# Patient Record
Sex: Female | Born: 1980 | Race: Black or African American | Hispanic: No | Marital: Married | Smoking: Never smoker
Health system: Southern US, Community
[De-identification: ages and names within clinical notes are randomized; demographics above are authoritative.]

## PROBLEM LIST (undated history)

## (undated) DIAGNOSIS — E559 Vitamin D deficiency, unspecified: Secondary | ICD-10-CM

## (undated) HISTORY — DX: Vitamin D deficiency, unspecified: E55.9

---

## 2006-03-26 ENCOUNTER — Inpatient Hospital Stay (HOSPITAL_COMMUNITY): Admission: AD | Admit: 2006-03-26 | Discharge: 2006-03-27 | Payer: Self-pay | Admitting: Gynecology

## 2006-03-29 ENCOUNTER — Inpatient Hospital Stay (HOSPITAL_COMMUNITY): Admission: AD | Admit: 2006-03-29 | Discharge: 2006-03-29 | Payer: Self-pay | Admitting: *Deleted

## 2006-03-31 ENCOUNTER — Inpatient Hospital Stay (HOSPITAL_COMMUNITY): Admission: AD | Admit: 2006-03-31 | Discharge: 2006-03-31 | Payer: Self-pay | Admitting: Obstetrics and Gynecology

## 2006-07-03 ENCOUNTER — Encounter (INDEPENDENT_AMBULATORY_CARE_PROVIDER_SITE_OTHER): Payer: Self-pay | Admitting: Specialist

## 2006-07-03 ENCOUNTER — Inpatient Hospital Stay (HOSPITAL_COMMUNITY): Admission: AD | Admit: 2006-07-03 | Discharge: 2006-07-03 | Payer: Self-pay | Admitting: Obstetrics and Gynecology

## 2007-02-20 ENCOUNTER — Observation Stay (HOSPITAL_COMMUNITY): Admission: AD | Admit: 2007-02-20 | Discharge: 2007-02-21 | Payer: Self-pay | Admitting: Obstetrics and Gynecology

## 2007-03-02 ENCOUNTER — Ambulatory Visit: Payer: Self-pay | Admitting: Oncology

## 2007-03-03 LAB — CBC WITH DIFFERENTIAL (CANCER CENTER ONLY)
BASO%: 0.6 % (ref 0.0–2.0)
Eosinophils Absolute: 0.2 10*3/uL (ref 0.0–0.5)
HGB: 12.1 g/dL (ref 11.6–15.9)
LYMPH%: 11.4 % — ABNORMAL LOW (ref 14.0–48.0)
MCH: 30.8 pg (ref 26.0–34.0)
MCV: 91 fL (ref 81–101)
MONO%: 6.5 % (ref 0.0–13.0)
NEUT%: 79.5 % (ref 39.6–80.0)
RBC: 3.93 10*6/uL (ref 3.70–5.32)
WBC: 10.5 10*3/uL — ABNORMAL HIGH (ref 3.9–10.0)

## 2007-03-03 LAB — COMPREHENSIVE METABOLIC PANEL
Chloride: 103 mEq/L (ref 96–112)
Potassium: 3.2 mEq/L — ABNORMAL LOW (ref 3.5–5.3)
Sodium: 133 mEq/L — ABNORMAL LOW (ref 135–145)
Total Bilirubin: 0.7 mg/dL (ref 0.3–1.2)

## 2007-03-08 LAB — HYPERCOAGULABLE PANEL, COMPREHENSIVE
AntiThromb III Func: 100 % (ref 76–126)
Anticardiolipin IgA: <7 [APL'U] (ref <13)
Anticardiolipin IgM: 26 [MPL'U] (ref <10)
Beta-2-Glycoprotein I IgM: 9 U/mL (ref <10)
DRVVT: 38.4 secs (ref 36.1–47.0)
Homocysteine: 4.5 umol/L (ref 4.0–15.4)
PTT Lupus Anticoagulant: 62.5 secs — ABNORMAL HIGH (ref 36.3–48.8)
PTTLA 4:1 Mix: 62.5 secs — ABNORMAL HIGH (ref 36.3–48.8)
PTTLA Confirmation: 17.5 secs — ABNORMAL HIGH (ref <8.0)
Protein C Activity: 117 % (ref 75–133)
Protein S Activity: 25 % — ABNORMAL LOW (ref 69–129)
Protein S Ag, Total: 114 % (ref 70–140)

## 2007-04-18 ENCOUNTER — Ambulatory Visit: Payer: Self-pay | Admitting: Oncology

## 2007-05-17 LAB — CBC WITH DIFFERENTIAL (CANCER CENTER ONLY)
BASO#: 0 10e3/uL (ref 0.0–0.2)
BASO%: 0.5 % (ref 0.0–2.0)
EOS%: 2 % (ref 0.0–7.0)
Eosinophils Absolute: 0.1 10e3/uL (ref 0.0–0.5)
HCT: 29.5 % — ABNORMAL LOW (ref 34.8–46.6)
HGB: 10.2 g/dL — ABNORMAL LOW (ref 11.6–15.9)
LYMPH#: 1.2 10e3/uL (ref 0.9–3.3)
LYMPH%: 18.1 % (ref 14.0–48.0)
MCH: 32 pg (ref 26.0–34.0)
MCHC: 34.6 g/dL (ref 32.0–36.0)
MCV: 92 fL (ref 81–101)
MONO#: 0.4 10e3/uL (ref 0.1–0.9)
MONO%: 5.2 % (ref 0.0–13.0)
NEUT#: 5.1 10e3/uL (ref 1.5–6.5)
NEUT%: 74.2 % (ref 39.6–80.0)
Platelets: 155 10e3/uL (ref 145–400)
RBC: 3.19 10e6/uL — ABNORMAL LOW (ref 3.70–5.32)
RDW: 12.1 % (ref 10.5–14.6)
WBC: 6.8 10e3/uL (ref 3.9–10.0)

## 2007-07-26 ENCOUNTER — Ambulatory Visit: Payer: Self-pay | Admitting: Oncology

## 2007-07-28 LAB — CBC WITH DIFFERENTIAL (CANCER CENTER ONLY)
BASO#: 0.1 10*3/uL (ref 0.0–0.2)
BASO%: 0.6 % (ref 0.0–2.0)
EOS%: 1.8 % (ref 0.0–7.0)
HCT: 31.1 % — ABNORMAL LOW (ref 34.8–46.6)
HGB: 10.6 g/dL — ABNORMAL LOW (ref 11.6–15.9)
LYMPH#: 1.4 10*3/uL (ref 0.9–3.3)
LYMPH%: 13 % — ABNORMAL LOW (ref 14.0–48.0)
MONO#: 0.7 10*3/uL (ref 0.1–0.9)
MONO%: 6 % (ref 0.0–13.0)
NEUT#: 8.5 10*3/uL — ABNORMAL HIGH (ref 1.5–6.5)
RBC: 3.3 10*6/uL — ABNORMAL LOW (ref 3.70–5.32)

## 2007-08-24 ENCOUNTER — Inpatient Hospital Stay (HOSPITAL_COMMUNITY): Admission: AD | Admit: 2007-08-24 | Discharge: 2007-08-24 | Payer: Self-pay | Admitting: *Deleted

## 2007-08-25 ENCOUNTER — Inpatient Hospital Stay (HOSPITAL_COMMUNITY): Admission: AD | Admit: 2007-08-25 | Discharge: 2007-08-25 | Payer: Self-pay | Admitting: Obstetrics

## 2007-09-26 ENCOUNTER — Inpatient Hospital Stay (HOSPITAL_COMMUNITY): Admission: AD | Admit: 2007-09-26 | Discharge: 2007-09-28 | Payer: Self-pay | Admitting: Obstetrics and Gynecology

## 2007-10-26 ENCOUNTER — Ambulatory Visit: Payer: Self-pay | Admitting: Oncology

## 2007-11-07 LAB — CBC WITH DIFFERENTIAL (CANCER CENTER ONLY)
BASO#: 0 10*3/uL (ref 0.0–0.2)
EOS%: 4 % (ref 0.0–7.0)
Eosinophils Absolute: 0.2 10*3/uL (ref 0.0–0.5)
LYMPH%: 29 % (ref 14.0–48.0)
MCV: 90 fL (ref 81–101)
MONO%: 7.3 % (ref 0.0–13.0)
NEUT%: 59.3 % (ref 39.6–80.0)
Platelets: 178 10*3/uL (ref 145–400)
RDW: 11.1 % (ref 10.5–14.6)
WBC: 4.9 10*3/uL (ref 3.9–10.0)

## 2007-11-09 LAB — PROTEIN C ACTIVITY: Protein C Activity: 119 % (ref 75–133)

## 2007-11-09 LAB — PROTEIN C, TOTAL: Protein C, Total: 71 % (ref 70–140)

## 2007-11-09 LAB — PROTEIN S ACTIVITY: Protein S Activity: 26 % — ABNORMAL LOW (ref 69–129)

## 2007-11-09 LAB — PROTEIN S, ANTIGEN, FREE: Protein S Ag, Free: 68 % normal (ref 50–147)

## 2009-03-29 IMAGING — US US OB TRANSVAGINAL
1 series · 14 of 28 positions shown · non-contrast
Comparison: None.

CLINICAL DATA: Positive pregnancy test, nausea.  
 TRANSVAGINAL OBSTETRICAL ULTRASOUND:

[Series 1: us ob transvaginal · 14 of 34 slices shown]
[im 2/34]
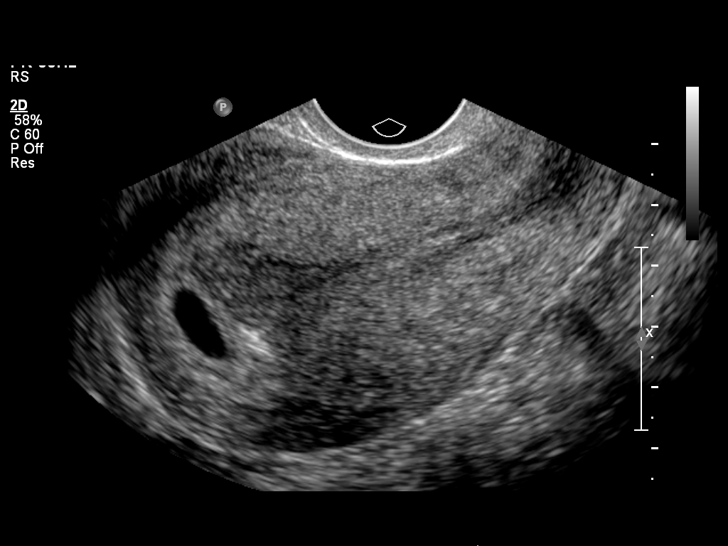
[im 4/34]
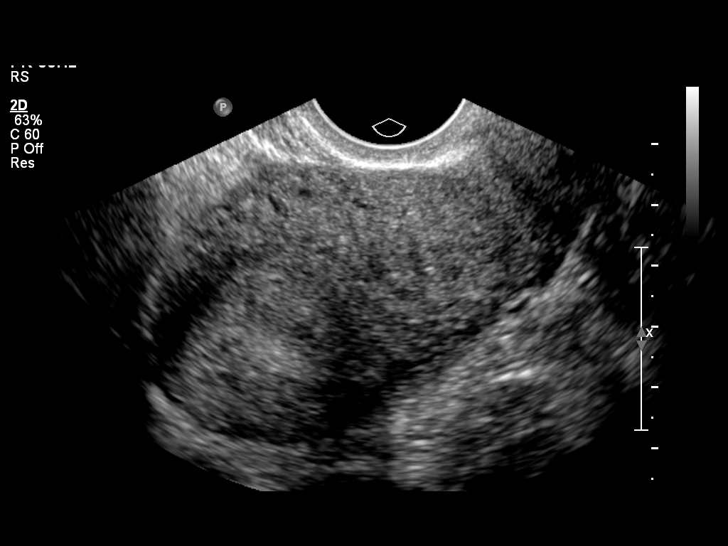
[im 7/34]
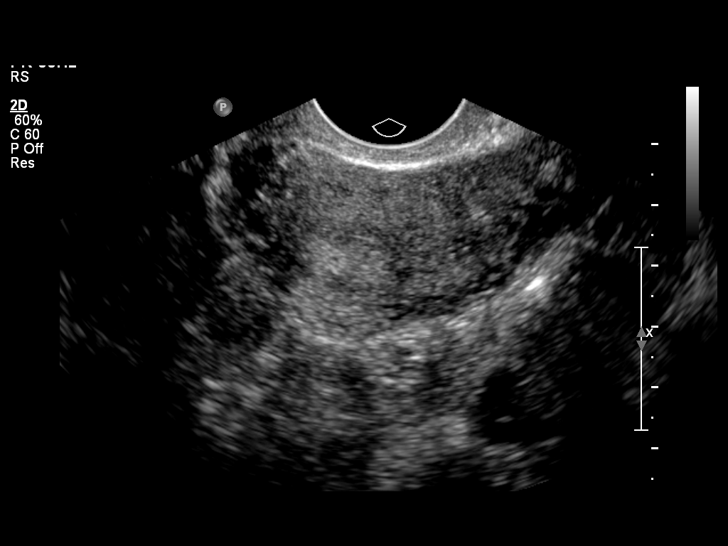
[im 9/34]
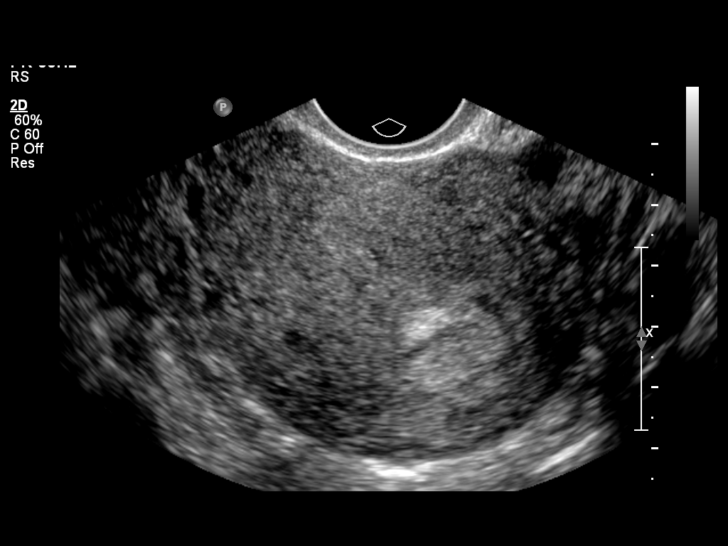
[im 12/34]
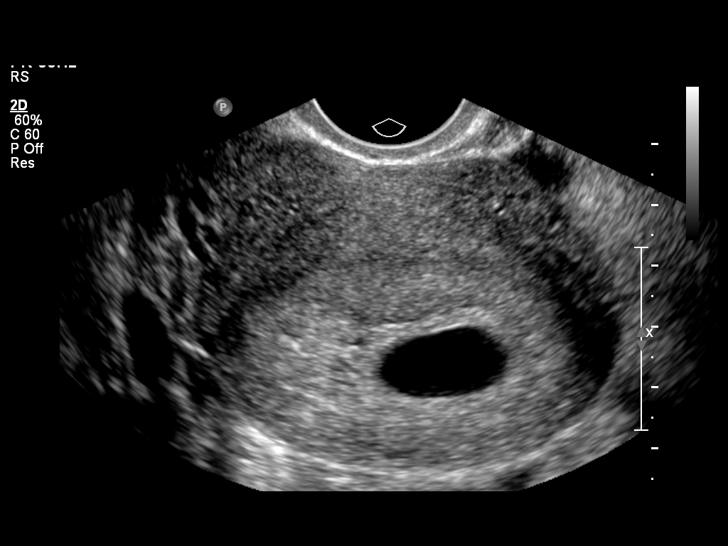
[im 14/34]
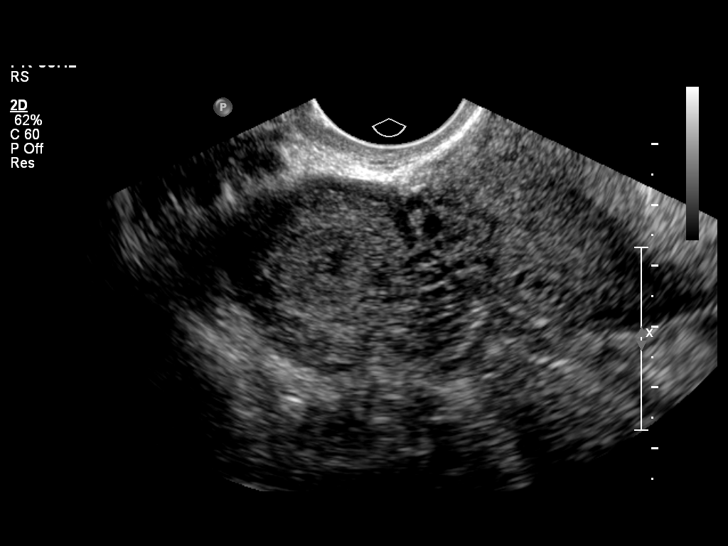
[im 16/34]
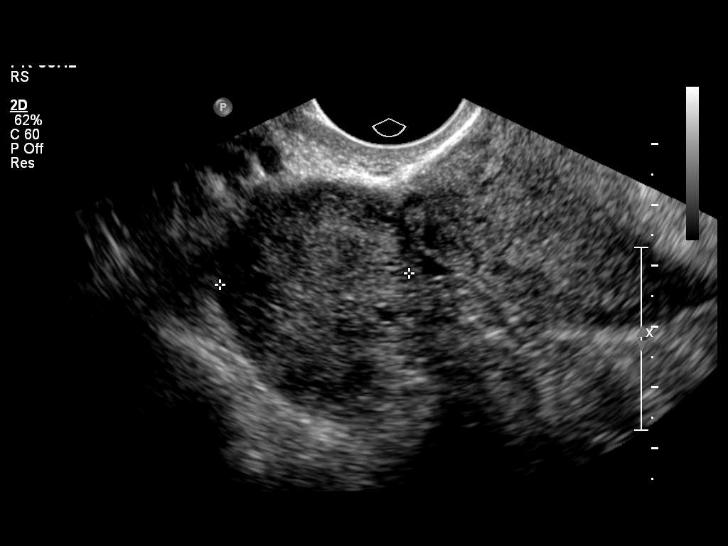
[im 19/34]
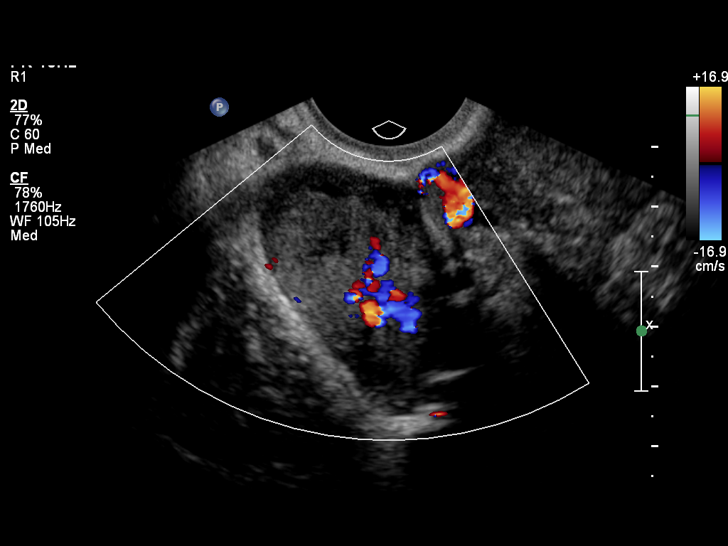
[im 21/34]
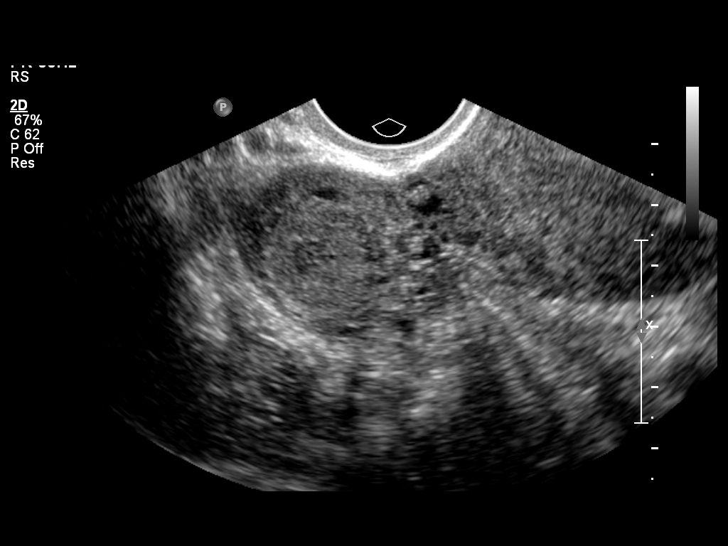
[im 24/34]
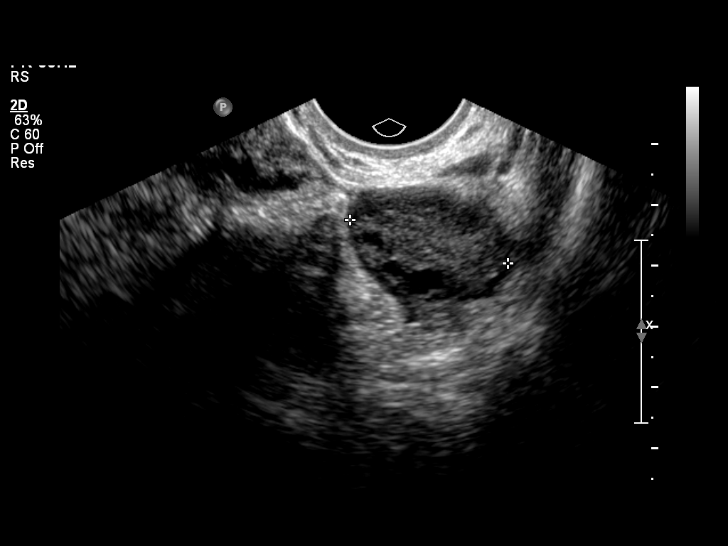
[im 26/34]
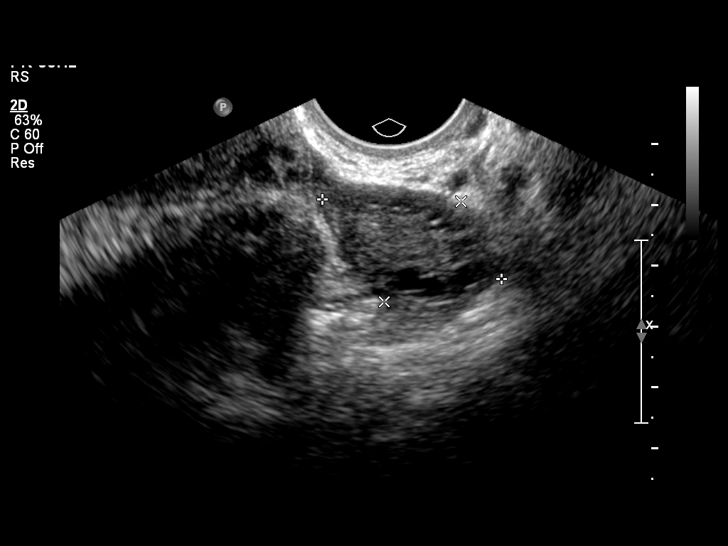
[im 29/34]
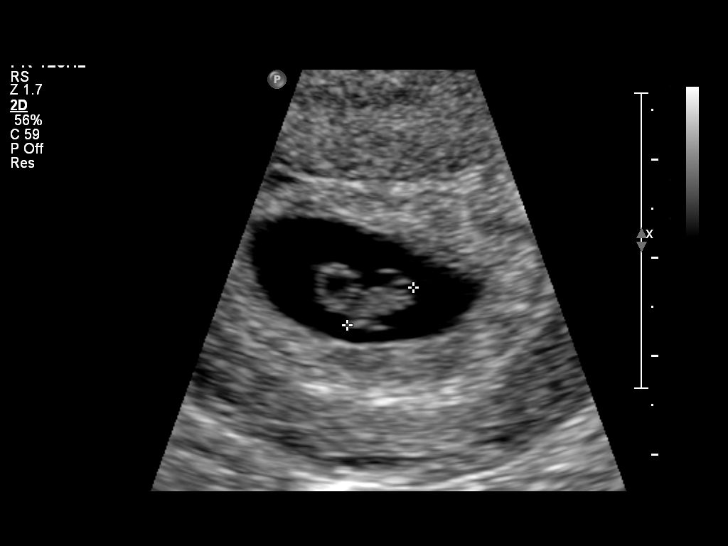
[im 31/34]
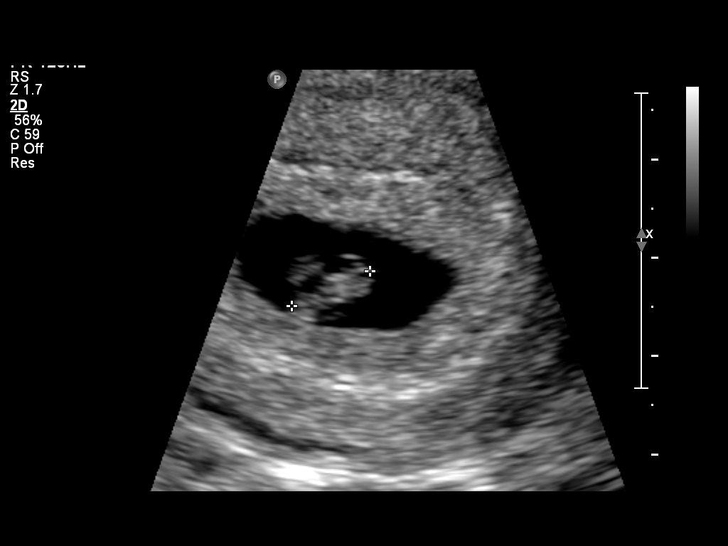
[im 34/34]
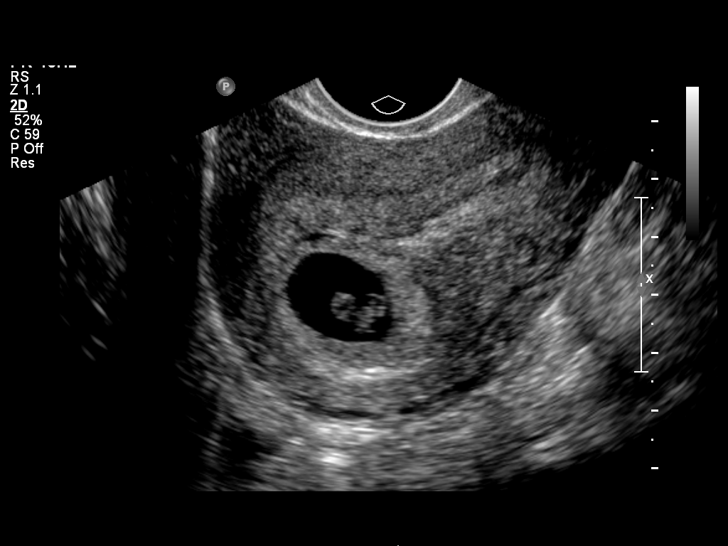

[14 of 28 positions shown; findings below may reference images not displayed]

Number of Fetuses:  1
 Yolk sac:  Yes
 Embryo:  Yes
 Cardiac Activity:  Yes
 Heart Rate:  134 bpm
 CRL:  0.83 cm  6 w 6 d  US EDC:  10/10/07
 Fetal anatomy could not be evaluated due to the early gestational age.
 MATERNAL UTERINE AND ADNEXAL FINDINGS
 Subchorionic hemorrhage:  None.
 1.9 x 1.8 x 1.4 cm thick-walled right ovarian cyst, likely a corpus luteum.
IMPRESSION: Intrauterine gestational sac with yolk sac, fetal pole, and cardiac activity noted.  6 weeks 6 days by crown-rump length.  US EDC 10/10/07.

## 2010-06-01 ENCOUNTER — Encounter: Payer: Self-pay | Admitting: Obstetrics and Gynecology

## 2010-09-26 NOTE — Consult Note (Signed)
NAME:  Christine Atkinson, Christine Atkinson NO.:  [id number]   MEDICAL RECORD NO.:  [id number]          PATIENT TYPE:  MAT   LOCATION:  [facility]                          FACILITY:  [facility]   PHYSICIAN:  Richardean Sale, M.D.   DATE OF BIRTH:  1980-11-10   DATE OF CONSULTATION:  DATE OF DISCHARGE:                                 CONSULTATION   CHIEF COMPLAINT:  Vaginal bleeding.   HISTORY OF PRESENT ILLNESS:  This is a 30 year old gravida 1, para 0,  African American female who was at 98 weeks' gestation with a due date  of November 16, 2006, by first trimester ultrasound who noted sudden onset of  heavy vaginal bleeding this morning after recent sexual intercourse.  The patient states initially the bleeding was very watery, then followed  by passage of large clots, and onset of intermittent abdominal pain.  The patient says the pain is cramping.  It comes and goes.  It moderate  to severe in the lower pelvis.  Denies any back pain.  She denies any  fever, chills, or sweats.  No chest pain, shortness of breath.  Has not  felt the baby move today.   PRENATAL CARE:  [facility] with Dr. Cherly Hensen as the primary  physician.  Pregnancy was complicated by an early identified partial  previa that had resolved on her most recent ultrasound on June 16, 2006.  The patient did have some vaginal spotting early in the pregnancy  as well as bacterial vaginosis which was treated.  Ultra screen and AFP  and anatomic survey were all normal.  Pregnancy was also complicated by  hyperemesis gravidarum for which the patient was on home IV fluids and a  Zofran pump.   PAST OBSTETRIC HISTORY:  None.   GYNECOLOGIC HISTORY:  Positive abnormal Pap smear but no treatment  needed.  Denies any HSV or sexually transmitted infections.   PAST MEDICAL HISTORY:  No prior hospitalization.   SURGICAL HISTORY:  None.   FAMILY HISTORY:  Negative for breast, colon, ovarian cancer, or any  birth defects, congenital  anomalies, Down syndrome, spina bifida, cystic  fibrosis, or other birth defects.   SOCIAL HISTORY:  She is single.  Father of the baby is supportive.  She  denies tobacco, alcohol, or drugs.   MEDICATIONS:  Prenatal vitamins, previously on Zofran pump.   ALLERGIES:  NO KNOWN DRUG ALLERGIES.   PHYSICAL EXAMINATION:  VITAL SIGNS:  Temp is 97.7, respirations 22,  heart rate 81, blood pressure 111/64.  GENERAL:  She is a well-developed, well-nourished, African American  female who appears in a moderate amount of discomfort but no acute  distress.  HEART:  Regular rate and rhythm.  LUNGS:  Clear.  ABDOMEN:  Soft with fundal tenderness.  Contractions palpate moderate.  No hepatosplenomegaly.  EXTREMITIES:  No cyanosis, clubbing, or edema.  PELVIC:  An ultrasound was performed at the bedside, given the patient's  history of a previa which revealed the fetus very low in the pelvis with  no cardiac activity and no amniotic fluid.  Speculum exam:  There is a  moderate  amount of blood in the vagina.  After blood was cleared with  sock swabs, fetal parts were visible in the vagina.  On digital exam,  presentation was transverse, completely dilated.   The patient subsequently delivered a nonviable female fetus, followed by  complete and spontaneous delivery of the placenta.   LABORATORY STUDIES:  White count 16, hemoglobin 8, hematocrit 32,  platelets 170.   ASSESSMENT:  A 30 year old gravida 1, para 0-0-1-0, now status post  spontaneous delivery of a nonviable female infant at 44 weeks' gestation.   PLAN:  1. The patient is hemodynamically stable.  2. Given her elevated white count, we will administer a dose of      antibiotics.  3. The patient's bleeding has improved significantly.  4. We will place in observation and monitor throughout the day with      possible discharge to home later this evening.  5. The patient received Stadol and Phenergan intravenously for pain      relief during  delivery.  Her pain has improved significantly.  We      will re administer as needed.  6. Recommend autopsy and will obtain placental cultures to help      determine possible etiology of second trimester demise.      Richardean Sale, M.D.  Electronically Signed     JW/MEDQ  D:  07/03/2006  T:  07/03/2006  Job:  [id number]

## 2011-02-04 LAB — RPR: RPR Ser Ql: NONREACTIVE

## 2011-02-04 LAB — CBC
HCT: 28.9 — ABNORMAL LOW
Hemoglobin: 10 — ABNORMAL LOW
Hemoglobin: 11.1 — ABNORMAL LOW
MCHC: 34.7
MCV: 94.6
MCV: 95.8
Platelets: 151
Platelets: 176
RDW: 13.1
WBC: 9.5

## 2011-02-04 LAB — DIFFERENTIAL
Eosinophils Absolute: 0.1
Eosinophils Relative: 1
Neutro Abs: 7.7
Neutrophils Relative %: 80 — ABNORMAL HIGH

## 2011-02-04 LAB — CCBB MATERNAL DONOR DRAW

## 2011-02-19 LAB — LUPUS ANTICOAGULANT PANEL
DRVVT: 44 (ref 36.1–47.0)
PTT Lupus Anticoagulant: 61.8 — ABNORMAL HIGH (ref 36.3–48.8)
PTTLA 4:1 Mix: 58.1 — ABNORMAL HIGH (ref 36.3–48.8)
PTTLA Confirmation: 13.4 — ABNORMAL HIGH (ref <8.0)

## 2011-02-19 LAB — URINALYSIS, ROUTINE W REFLEX MICROSCOPIC
Glucose, UA: NEGATIVE
Glucose, UA: NEGATIVE
Leukocytes, UA: NEGATIVE
Nitrite: NEGATIVE
pH: 6

## 2011-02-19 LAB — URINE MICROSCOPIC-ADD ON

## 2011-02-19 LAB — POCT PREGNANCY, URINE
Operator id: 20265
Preg Test, Ur: POSITIVE

## 2011-02-19 LAB — ANTITHROMBIN III: AntiThromb III Func: 87 (ref 76–126)

## 2011-02-19 LAB — HOMOCYSTEINE: Homocysteine: 6.6

## 2011-02-19 LAB — TSH: TSH: 0.385

## 2011-02-19 LAB — T3: T3, Total: 87.2

## 2011-02-19 LAB — BETA-2-GLYCOPROTEIN I ABS, IGG/M/A: Beta-2-Glycoprotein I IgM: 11 U/mL (ref <10)

## 2014-08-20 ENCOUNTER — Telehealth: Payer: Self-pay | Admitting: General Practice

## 2014-08-20 NOTE — Telephone Encounter (Signed)
Patient went to urgent care

## 2014-09-05 ENCOUNTER — Encounter (INDEPENDENT_AMBULATORY_CARE_PROVIDER_SITE_OTHER): Payer: Self-pay

## 2014-09-05 ENCOUNTER — Telehealth: Payer: Self-pay | Admitting: Family Medicine

## 2014-09-05 ENCOUNTER — Ambulatory Visit (INDEPENDENT_AMBULATORY_CARE_PROVIDER_SITE_OTHER): Payer: 59 | Admitting: Family Medicine

## 2014-09-05 VITALS — BP 106/71 | HR 83 | Temp 99.4°F | Ht 67.0 in | Wt 215.0 lb

## 2014-09-05 DIAGNOSIS — J40 Bronchitis, not specified as acute or chronic: Secondary | ICD-10-CM | POA: Diagnosis not present

## 2014-09-05 DIAGNOSIS — J029 Acute pharyngitis, unspecified: Secondary | ICD-10-CM

## 2014-09-05 DIAGNOSIS — J329 Chronic sinusitis, unspecified: Secondary | ICD-10-CM

## 2014-09-05 LAB — POCT RAPID STREP A (OFFICE): Rapid Strep A Screen: NEGATIVE

## 2014-09-05 MED ORDER — PSEUDOEPHEDRINE-CODEINE-GG 30-10-100 MG/5ML PO SOLN: 10.0000 mL | Freq: Four times a day (QID) | ORAL | Status: DC | PRN

## 2014-09-05 MED ORDER — LEVOFLOXACIN 500 MG PO TABS: 500.0000 mg | ORAL_TABLET | Freq: Every day | ORAL | Status: DC

## 2014-09-05 NOTE — Telephone Encounter (Signed)
Patient has uhc and is only on vitamin d appointment given for today at 5:25pm with [facility].

## 2014-09-05 NOTE — Progress Notes (Signed)
Subjective:  Patient ID: Melina Schoolsanika [place], female    DOB: 05/31/1980  Age: 34 y.o. MRN: [id number]  CC: URI   HPI Melina Schoolsanika [place]ihu[place]ua presents for Symptoms include congestion, facial pain, nasal congestion, no  fever, non productive cough, post nasal drip and sinus pressure with no fever, chills, night sweats or weight loss. Onset of symptoms was a few days ago, gradually worsening since that time. Pt.is drinking moderate amounts of fluids.     History Alcario Droughtanika has no past medical history on file.   She has no past surgical history on file.   Her family history is not on file.She reports that she has never smoked. She does not have any smokeless tobacco history on file. Her alcohol and drug histories are not on file.  No current outpatient prescriptions on file prior to visit.   No current facility-administered medications on file prior to visit.    ROS Review of Systems  Constitutional: Negative for fever, chills, activity change and appetite change.  HENT: Positive for congestion, postnasal drip, rhinorrhea and sinus pressure. Negative for ear discharge, ear pain, hearing loss, nosebleeds, sneezing and trouble swallowing.   Respiratory: Negative for chest tightness and shortness of breath.   Cardiovascular: Negative for chest pain and palpitations.  Skin: Negative for rash.    Objective:  BP 106/71 mmHg  Pulse 83  Temp(Src) 99.4 F (37.4 C) (Oral)  Ht 5\' 7"  (1.702 m)  Wt 215 lb (97.523 kg)  BMI 33.67 kg/m2  BP Readings from Last 3 Encounters:  09/05/14 106/71    Wt Readings from Last 3 Encounters:  09/05/14 215 lb (97.523 kg)     Physical Exam  Constitutional: She appears well-developed and well-nourished.  HENT:  Head: Normocephalic and atraumatic.  Right Ear: Tympanic membrane and external ear normal. No decreased hearing is noted.  Left Ear: Tympanic membrane and external ear normal. No decreased hearing is noted.  Nose: Mucosal edema present. Right sinus exhibits no  frontal sinus tenderness. Left sinus exhibits no frontal sinus tenderness.  Mouth/Throat: No oropharyngeal exudate or posterior oropharyngeal erythema.  Neck: No Brudzinski's sign noted.  Pulmonary/Chest: Breath sounds normal. No respiratory distress.  Lymphadenopathy:       Head (right side): No preauricular adenopathy present.       Head (left side): No preauricular adenopathy present.       Right cervical: No superficial cervical adenopathy present.      Left cervical: No superficial cervical adenopathy present.    No results found for: HGBA1C    No results found.  Assessment & Plan:   Alcario Droughtanika was seen today for uri.  Diagnoses and all orders for this visit:  Sore throat Orders: -     POCT rapid strep A  Sinobronchitis  Other orders -     levofloxacin (LEVAQUIN) 500 MG tablet; Take 1 tablet (500 mg total) by mouth daily. -     Discontinue: pseudoephedrine-codeine-guaifenesin (MYTUSSIN DAC) 30-10-100 MG/5ML solution; Take 10 mLs by mouth 4 (four) times daily as needed for cough.   I am having Ms. Cardiff start on levofloxacin. I am also having her maintain her Vitamin D (Ergocalciferol).  Meds ordered this encounter  Medications  . Vitamin D, Ergocalciferol, (DRISDOL) 50000 UNITS CAPS capsule    Sig: Take 50,000 Units by mouth every 7 (seven) days.  Marland Kitchen. levofloxacin (LEVAQUIN) 500 MG tablet    Sig: Take 1 tablet (500 mg total) by mouth daily.    Dispense:  10 tablet  Refill:  0  . DISCONTD: pseudoephedrine-codeine-guaifenesin (MYTUSSIN DAC) 30-10-100 MG/5ML solution    Sig: Take 10 mLs by mouth 4 (four) times daily as needed for cough.    Dispense:  120 mL    Refill:  0     Follow-up: Return if symptoms worsen or fail to improve, for CPE soon.  Mechele Claude, M.D.

## 2014-09-06 ENCOUNTER — Telehealth: Payer: Self-pay | Admitting: Family Medicine

## 2014-09-06 MED ORDER — BENZONATATE 200 MG PO CAPS: 200.0000 mg | ORAL_CAPSULE | Freq: Three times a day (TID) | ORAL | Status: DC | PRN

## 2014-09-06 NOTE — Telephone Encounter (Signed)
Pt aware refill was sent to pharmacy 

## 2014-09-09 ENCOUNTER — Encounter: Payer: Self-pay | Admitting: Family Medicine

## 2014-12-18 ENCOUNTER — Ambulatory Visit (INDEPENDENT_AMBULATORY_CARE_PROVIDER_SITE_OTHER): Payer: 59 | Admitting: Nurse Practitioner

## 2014-12-18 ENCOUNTER — Encounter: Payer: Self-pay | Admitting: Nurse Practitioner

## 2014-12-18 VITALS — BP 120/74 | HR 69 | Temp 99.1°F | Ht 67.0 in | Wt 215.0 lb

## 2014-12-18 DIAGNOSIS — J039 Acute tonsillitis, unspecified: Secondary | ICD-10-CM

## 2014-12-18 DIAGNOSIS — J029 Acute pharyngitis, unspecified: Secondary | ICD-10-CM

## 2014-12-18 LAB — POCT RAPID STREP A (OFFICE): RAPID STREP A SCREEN: NEGATIVE

## 2014-12-18 MED ORDER — AMOXICILLIN 875 MG PO TABS: 875.0000 mg | ORAL_TABLET | Freq: Two times a day (BID) | ORAL | Status: DC

## 2014-12-18 NOTE — Patient Instructions (Signed)

## 2014-12-18 NOTE — Progress Notes (Signed)
  Subjective:     Christine Atkinson is a 34 y.o. female who presents for evaluation of sore throat. Associated symptoms include nasal blockage, post nasal drip, sinus and nasal congestion and sore throat. Onset of symptoms was 2 days ago, and have been gradually worsening since that time. She is drinking plenty of fluids. She has not had a recent close exposure to someone with proven streptococcal pharyngitis.  The following portions of the patient's history were reviewed and updated as appropriate: allergies, current medications, past family history, past medical history, past social history, past surgical history and problem list.  Review of Systems Pertinent items are noted in HPI.    Objective:    BP 120/74 mmHg  Pulse 69  Temp(Src) 99.1 F (37.3 C) (Oral)  Ht  (1.702 m)  Wt 215 lb (97.523 kg)  BMI 33.67 kg/m2 General appearance: alert and cooperative Eyes: conjunctivae/corneas clear. PERRL, EOM's intact. Fundi benign. Ears: normal TM's and external ear canals both ears Nose: Nares normal. Septum midline. Mucosa normal. No drainage or sinus tenderness. Throat: lips, mucosa, and tongue normal; teeth and gums normal Neck: no adenopathy, no carotid bruit, no JVD, supple, symmetrical, trachea midline, thyroid not enlarged, symmetric, no tenderness/mass/nodules and posterior oral pharynx erythematous , edematous  with white excudate on left tonsil. Lungs: clear to auscultation bilaterally Heart: regular rate and rhythm, S1, S2 normal, no murmur, click, rub or gallop  Laboratory Strep test done. Results: Results for orders placed or performed in visit on 12/18/14  POCT rapid strep A  Result Value Ref Range   Rapid Strep A Screen Negative Negative     Assessment:    Acute pharyngitis, likely  excudative tonsillitis.    Plan:      Meds ordered this encounter  Medications  . amoxicillin (AMOXIL) 875 MG tablet    Sig: Take 1 tablet (875 mg total) by mouth 2 (two) times daily.  1 po BID    Dispense:  20 tablet    Refill:  0    Order Specific Question:  Supervising Provider    Answer:  Deborra Medina   Force fluids Motrin or tylenol OTC OTC decongestant Throat lozenges if help New toothbrush in 3 days  Mary-Margaret Daphine Deutscher, FNP

## 2014-12-21 DIAGNOSIS — Z0289 Encounter for other administrative examinations: Secondary | ICD-10-CM

## 2015-01-22 ENCOUNTER — Ambulatory Visit (INDEPENDENT_AMBULATORY_CARE_PROVIDER_SITE_OTHER): Payer: 59 | Admitting: Family

## 2015-01-22 ENCOUNTER — Encounter: Payer: Self-pay | Admitting: Family

## 2015-01-22 VITALS — BP 118/79 | HR 56 | Temp 98.4°F | Ht 67.0 in | Wt 206.8 lb

## 2015-01-22 DIAGNOSIS — R079 Chest pain, unspecified: Secondary | ICD-10-CM | POA: Diagnosis not present

## 2015-01-22 DIAGNOSIS — R0602 Shortness of breath: Secondary | ICD-10-CM

## 2015-01-22 DIAGNOSIS — J309 Allergic rhinitis, unspecified: Secondary | ICD-10-CM

## 2015-01-22 NOTE — Patient Instructions (Signed)

## 2015-01-22 NOTE — Progress Notes (Signed)
   Subjective:    Patient ID: Christine Atkinson, female    DOB: Nov 03, 1980, 34 y.o.   MRN: [id number]  HPI Pt presents to the office today for hospital follow up for left chest pain, SOB, dizziness, with numbness radiating down left arm. Pt states she had an EKG, Chest X-Ray, and lab work which were all negative. Pt states she was told she was negative for a MI, blood clot, or pneumonia. Pt states she has felt this chest pain over the last month several times a week.  Pt denies any triggering factors. Pt denies any anxiety or GERD. PT denies being a current smoker. Pt states her parental grandfather died of a MI.   Reviewed pt's hospital notes from [facility].    Review of Systems  Constitutional: Negative.   HENT: Negative.   Eyes: Negative.   Respiratory: Negative.  Negative for shortness of breath.   Cardiovascular: Negative.  Negative for palpitations.  Gastrointestinal: Negative.   Endocrine: Negative.   Genitourinary: Negative.   Musculoskeletal: Negative.   Neurological: Negative.  Negative for headaches.  Hematological: Negative.   Psychiatric/Behavioral: Negative.   All other systems reviewed and are negative.      Objective:   Physical Exam  Constitutional: She is oriented to person, place, and time. She appears well-developed and well-nourished. No distress.  HENT:  Head: Normocephalic and atraumatic.  Right Ear: External ear normal.  Left Ear: External ear normal.  Nasal passage erythemas with mild swelling  Oropharynx erythemas  Eyes: Pupils are equal, round, and reactive to light.  Neck: Normal range of motion. Neck supple. No thyromegaly present.  Cardiovascular: Normal rate, regular rhythm, normal heart sounds and intact distal pulses.   No murmur heard. Pulmonary/Chest: Effort normal and breath sounds normal. No respiratory distress. She has no wheezes.  Abdominal: Soft. Bowel sounds are normal. She exhibits no distension. There is no tenderness.  Musculoskeletal:  Normal range of motion. She exhibits no edema or tenderness.  Neurological: She is alert and oriented to person, place, and time. She has normal reflexes. No cranial nerve deficit.  Skin: Skin is warm and dry.  Psychiatric: She has a normal mood and affect. Her behavior is normal. Judgment and thought content normal.  Vitals reviewed.   BP 118/79 mmHg  Pulse 56  Temp(Src) 98.4 F (36.9 C) (Oral)  Ht  (1.702 m)  Wt 206 lb 12.8 oz (93.804 kg)  BMI 32.38 kg/m2       Assessment & Plan:  1. Chest pain, unspecified chest pain type - Ambulatory referral to Cardiology  2. SOB (shortness of breath) - Ambulatory referral to Cardiology  3. Allergic rhinitis, unspecified allergic rhinitis type    Continue all meds Diet and exercise encouraged RTO as needed- Cardiologists referral sent Pt to go to ED if chest pain becomes worse or SOB  Jannifer Rodney, FNP

## 2015-01-23 ENCOUNTER — Telehealth: Payer: Self-pay | Admitting: Family Medicine

## 2015-01-23 MED ORDER — ESCITALOPRAM OXALATE 10 MG PO TABS: 10.0000 mg | ORAL_TABLET | Freq: Every day | ORAL | Status: DC

## 2015-01-23 NOTE — Telephone Encounter (Signed)
Lexapro 10 mg Prescription sent to pharmacy. Pt to take daily. Will take 8 weeks to get to therapeutic levels. Pt needs to make follow up in 8 weeks to to discuss how she is feeling and may need to increase at this time.

## 2015-01-23 NOTE — Telephone Encounter (Signed)
Pt had an episode after she left here yesterday and noted that she was anxious at the time and that you two had discussed possible anxiety during the visit.

## 2015-01-24 NOTE — Telephone Encounter (Signed)
Pt aware prescription sent in to pharmacy. To schedule an appt in 8wks and to keep cardiology appt when it gets scheduled.

## 2015-01-31 ENCOUNTER — Telehealth: Payer: Self-pay | Admitting: Family Medicine

## 2015-01-31 DIAGNOSIS — Z0289 Encounter for other administrative examinations: Secondary | ICD-10-CM

## 2015-01-31 NOTE — Telephone Encounter (Signed)
I explained to patient,  All medications have potential side effects.   She has decided to try Lexapro.

## 2015-03-05 ENCOUNTER — Ambulatory Visit: Payer: Self-pay | Admitting: Cardiology

## 2015-03-21 ENCOUNTER — Encounter: Payer: Self-pay | Admitting: Cardiology

## 2015-03-21 ENCOUNTER — Ambulatory Visit (INDEPENDENT_AMBULATORY_CARE_PROVIDER_SITE_OTHER): Payer: 59 | Admitting: Cardiology

## 2015-03-21 VITALS — BP 112/74 | HR 65 | Ht 68.0 in | Wt 205.0 lb

## 2015-03-21 DIAGNOSIS — R0789 Other chest pain: Secondary | ICD-10-CM

## 2015-03-21 NOTE — Progress Notes (Signed)
Patient ID: Tallyn Holroyd, female   DOB: [birth year], 34 y.o.   MRN: [id number]     Clinical Summary Ms. Gaugh is a 34 y.o.female seen today as a new patient for the following medical problems.  1. Chest pain - seen in [facility] ER 01/2015 with chest pain - EKG NSR. Trop neg, D-dimer neg. CXR no acute process - chest pain off and on x 6 months. Aching pain left sided 6-8/10. Can occur at rest or with exertion. Can last up to 10-20 minutes. Not positional. Notes mild DOE with activities. Pain occures 2-3 times a week. She reports a recent course of prednisone after a visit with urgent care that caused the chest pain to resolve for a period of time.    2. Clotting disorder - she reports an unclear history of a clotting disorder, She reports she was on anticoag in one point in the past but no longer on. She has been followed by heme/onc as well.   Past Medical History  Diagnosis Date  . Vitamin D deficiency disease      No Known Allergies   Current Outpatient Prescriptions  Medication Sig Dispense Refill  . escitalopram (LEXAPRO) 10 MG tablet Take 1 tablet (10 mg total) by mouth daily. 90 tablet 3  . Multiple Vitamin (MULTIVITAMIN) tablet Take 1 tablet by mouth daily.    . Vitamin D, Ergocalciferol, (DRISDOL) 50000 UNITS CAPS capsule Take 50,000 Units by mouth every 7 (seven) days.     No current facility-administered medications for this visit.        No Known Allergies    Denies any family history of heart disease   Social History Ms. Constantine reports that she has never smoked. She does not have any smokeless tobacco history on file. Ms. Baldridge reports that she does not drink alcohol.   Review of Systems CONSTITUTIONAL: No weight loss, fever, chills, weakness or fatigue.  HEENT: Eyes: No visual loss, blurred vision, double vision or yellow sclerae.No hearing loss, sneezing, congestion, runny nose or sore throat.  SKIN: No rash or itching.  CARDIOVASCULAR: per  hpi RESPIRATORY: No shortness of breath, cough or sputum.  GASTROINTESTINAL: No anorexia, nausea, vomiting or diarrhea. No abdominal pain or blood.  GENITOURINARY: No burning on urination, no polyuria NEUROLOGICAL: No headache, dizziness, syncope, paralysis, ataxia, numbness or tingling in the extremities. No change in bowel or bladder control.  MUSCULOSKELETAL: No muscle, back pain, joint pain or stiffness.  LYMPHATICS: No enlarged nodes. No history of splenectomy.  PSYCHIATRIC: No history of depression or anxiety.  ENDOCRINOLOGIC: No reports of sweating, cold or heat intolerance. No polyuria or polydipsia.  Marland Kitchen   Physical Examination Filed Vitals:   03/21/15 0914  BP: 112/74  Pulse: 65   Filed Vitals:   03/21/15 0914  Height:  (1.727 m)  Weight: 205 lb (92.987 kg)    Gen: resting comfortably, no acute distress HEENT: no scleral icterus, pupils equal round and reactive, no palptable cervical adenopathy,  CV: RRR, no m/rg, no jvd Resp: Clear to auscultation bilaterally GI: abdomen is soft, non-tender, non-distended, normal bowel sounds, no hepatosplenomegaly MSK: extremities are warm, no edema.  Skin: warm, no rash Neuro:  no focal deficits Psych: appropriate affect     Assessment and Plan  1. Atypical chest pain - no CAD risk factors. Symptoms were improved with recent course of prednisone, suggests MSK etiology - she will take 7 day course of ibuprofen. No further cardiac testing at this time  2. Clotting disorder -  details are unclear. This is followed by her pcp and heme/onc      Antoine PocheJonathan F. Branch, M.D.

## 2015-03-21 NOTE — Patient Instructions (Signed)
Medication Instructions:  START IBUPROFEN 400 MG THREE TIMES DAILY FOR ONE WEEK  Labwork: NONE  Testing/Procedures: NONE  Follow-Up: Your physician wants you to follow-up in: 6 MONTHS WITH DR. BRANCH. You will receive a reminder letter in the mail two months in advance. If you don't receive a letter, please call our office to schedule the follow-up appointment.   Any Other Special Instructions Will Be Listed Below (If Applicable).     If you need a refill on your cardiac medications before your next appointment, please call your pharmacy.  Thanks for choosing [facility]!!!

## 2015-05-14 ENCOUNTER — Telehealth: Payer: Self-pay | Admitting: Family Medicine

## 2015-05-17 NOTE — Telephone Encounter (Signed)
Does not do flu shots °

## 2015-06-10 ENCOUNTER — Ambulatory Visit (INDEPENDENT_AMBULATORY_CARE_PROVIDER_SITE_OTHER): Payer: 59 | Admitting: Family

## 2015-06-10 ENCOUNTER — Encounter: Payer: Self-pay | Admitting: Family

## 2015-06-10 VITALS — BP 113/78 | HR 61 | Temp 97.8°F | Ht 68.0 in | Wt 220.8 lb

## 2015-06-10 DIAGNOSIS — M2550 Pain in unspecified joint: Secondary | ICD-10-CM

## 2015-06-10 DIAGNOSIS — R0789 Other chest pain: Secondary | ICD-10-CM | POA: Diagnosis not present

## 2015-06-10 DIAGNOSIS — Z789 Other specified health status: Secondary | ICD-10-CM | POA: Diagnosis not present

## 2015-06-10 DIAGNOSIS — R5383 Other fatigue: Secondary | ICD-10-CM | POA: Diagnosis not present

## 2015-06-10 DIAGNOSIS — R6889 Other general symptoms and signs: Secondary | ICD-10-CM

## 2015-06-10 NOTE — Patient Instructions (Addendum)
Fatigue Fatigue is feeling tired all of the time, a lack of energy, or a lack of motivation. Occasional or mild fatigue is often a normal response to activity or life in general. However, long-lasting (chronic) or extreme fatigue may indicate an underlying medical condition. HOME CARE INSTRUCTIONS  Watch your fatigue for any changes. The following actions may help to lessen any discomfort you are feeling:  Talk to your health care provider about how much sleep you need each night. Try to get the required amount every night.  Take medicines only as directed by your health care provider.  Eat a healthy and nutritious diet. Ask your health care provider if you need help changing your diet.  Drink enough fluid to keep your urine clear or pale yellow.  Practice ways of relaxing, such as yoga, meditation, massage therapy, or acupuncture.  Exercise regularly.   Change situations that cause you stress. Try to keep your work and personal routine reasonable.  Do not abuse illegal drugs.  Limit alcohol intake to no more than 1 drink per day for nonpregnant women and 2 drinks per day for men. One drink equals 12 ounces of beer, 5 ounces of wine, or 1 ounces of hard liquor.  Take a multivitamin, if directed by your health care provider. SEEK MEDICAL CARE IF:   Your fatigue does not get better.  You have a fever.   You have unintentional weight loss or gain.  You have headaches.   You have difficulty:   Falling asleep.  Sleeping throughout the night.  You feel angry, guilty, anxious, or sad.   You are unable to have a bowel movement (constipation).   You skin is dry.   Your legs or another part of your body is swollen.  SEEK IMMEDIATE MEDICAL CARE IF:   You feel confused.   Your vision is blurry.  You feel faint or pass out.   You have a severe headache.   You have severe abdominal, pelvic, or back pain.   You have chest pain, shortness of breath, or an  irregular or fast heartbeat.   You are unable to urinate or you urinate less than normal.   You develop abnormal bleeding, such as bleeding from the rectum, vagina, nose, lungs, or nipples.  You vomit blood.   You have thoughts about harming yourself or committing suicide.   You are worried that you might harm someone else.    This information is not intended to replace advice given to you by your health care provider. Make sure you discuss any questions you have with your health care provider.   Document Released: 02/22/2007 Document Revised: 05/18/2014 Document Reviewed: 08/29/2013 Elsevier Interactive Patient Education 2016 [employer] Joint Pain Joint pain, which is also called arthralgia, can be caused by many things. Joint pain often goes away when you follow your health care provider's instructions for relieving pain at home. However, joint pain can also be caused by conditions that require further treatment. Common causes of joint pain include:  Bruising in the area of the joint.  Overuse of the joint.  Wear and tear on the joints that occur with aging (osteoarthritis).  Various other forms of arthritis.  A buildup of a crystal form of uric acid in the joint (gout).  Infections of the joint (septic arthritis) or of the bone (osteomyelitis). Your health care provider may recommend medicine to help with the pain. If your joint pain continues, additional tests may be needed to diagnose your condition.  HOME CARE INSTRUCTIONS Watch your condition for any changes. Follow these instructions as directed to lessen the pain that you are feeling.  Take medicines only as directed by your health care provider.  Rest the affected area for as long as your health care provider says that you should. If directed to do so, raise the painful joint above the level of your heart while you are sitting or lying down.  Do not do things that cause or worsen pain.  If directed, apply  ice to the painful area:  Put ice in a plastic bag.  Place a towel between your skin and the bag.  Leave the ice on for 20 minutes, 2-3 times per day.  Wear an elastic bandage, splint, or sling as directed by your health care provider. Loosen the elastic bandage or splint if your fingers or toes become numb and tingle, or if they turn cold and blue.  Begin exercising or stretching the affected area as directed by your health care provider. Ask your health care provider what types of exercise are safe for you.  Keep all follow-up visits as directed by your health care provider. This is important. SEEK MEDICAL CARE IF:  Your pain increases, and medicine does not help.  Your joint pain does not improve within 3 days.  You have increased bruising or swelling.  You have a fever.  You lose 10 lb (4.5 kg) or more without trying. SEEK IMMEDIATE MEDICAL CARE IF:  You are not able to move the joint.  Your fingers or toes become numb or they turn cold and blue.   This information is not intended to replace advice given to you by your health care provider. Make sure you discuss any questions you have with your health care provider.   Document Released: 04/27/2005 Document Revised: 05/18/2014 Document Reviewed: 02/06/2014 Elsevier Interactive Patient Education 2016 [employer] Rheumatoid Arthritis Rheumatoid arthritis is a long-term (chronic) inflammatory disease that causes pain, swelling, and stiffness of the joints. It can affect the entire body, including the eyes and lungs. The effects of rheumatoid arthritis vary widely among those with the condition. CAUSES The cause of rheumatoid arthritis is not known. It tends to run in families and is more common in women. Certain cells of the body's natural defense system (immune system) do not work properly and begin to attack healthy joints. It primarily involves the connective tissue that lines the joints (synovial membrane). This can cause  damage to the joint. SYMPTOMS  Pain, stiffness, swelling, and decreased motion of many joints, especially in the hands and feet.  Stiffness that is worse in the morning. It may last 1-2 hours or longer.  Numbness and tingling in the hands.  Fatigue.  Loss of appetite.  Weight loss.  Low-grade fever.  Dry eyes and mouth.  Firm lumps (rheumatoid nodules) that grow beneath the skin in areas such as the elbows and hands. DIAGNOSIS Diagnosis is based on the symptoms described, an exam, and blood tests. Sometimes, X-rays are helpful. TREATMENT The goals of treatment are to relieve pain, reduce inflammation, and to slow down or stop joint damage and disability. Methods vary and may include:  Maintaining a balance of rest, exercise, and proper nutrition.  Your health care provider may adjust your medicines every 3 months until treatment goals are reached. Common medicines include:  Pain relievers (analgesics).  Corticosteroids and nonsteroidal anti-inflammatory drugs (NSAIDs) to reduce inflammation.  Disease-modifying antirheumatic drugs (DMARDs) to try to slow the course of the  disease.  Biologic response modifiers to reduce inflammation and damage.  Physical therapy and occupational therapy.  Surgery for patients with severe joint damage. Joint replacement or fusing of joints may be needed.  Routine monitoring and ongoing care, such as office visits, blood and urine tests, and X-rays. Your health care provider will work with you to identify the best treatment option for you, based on an assessment of the overall disease activity in your body. HOME CARE INSTRUCTIONS  Remain physically active and reduce activity when the disease gets worse.  Eat a well-balanced diet.  Put heat on affected joints when you wake up and before activities. Keep the heat on the affected joint for as long as directed by your health care provider.  Put ice on affected joints following activities or  exercising.  Put ice in a plastic bag.  Place a towel between your skin and the bag.  Leave the ice on for 15-20 minutes, 3-4 times per day, or as directed by your health care provider.  Take medicines and supplements only as directed by your health care provider.  Use splints as directed by your health care provider. Splints help maintain joint position and function.  Do not sleep with pillows under your knees. This may lead to spasms.  Participate in a self-management program to keep current with the latest treatment and coping skills. SEEK IMMEDIATE MEDICAL CARE IF:  You have fainting episodes.  You have periods of extreme weakness.  You rapidly develop a hot, painful joint that is more severe than usual joint aches.  You have chills.  You have a fever. FOR MORE INFORMATION  American College [employer] Rheumatology: www.rheumatology.org  Arthritis Foundation: www.arthritis.org   This information is not intended to replace advice given to you by your health care provider. Make sure you discuss any questions you have with your health care provider.   Document Released: 04/24/2000 Document Revised: 05/18/2014 Document Reviewed: 06/03/2011 Elsevier Interactive Patient Education [employer]

## 2015-06-10 NOTE — Progress Notes (Signed)
   Subjective:    Patient ID: Christine Atkinson, female    DOB: Sep 21, 1980, 35 y.o.   MRN: [id number]  HPI PT presents to the office today with generalized joint pain in bilateral hands, knees, and back. Pt states the pain is worse in the morning and late in the evening. Pt is also complaining of intermittent chest pain that lasts 5-10 mins. Pt was referred to Cardiologists for this pain and her Cardiologists believes this was more MSK because the pain improved with prednisone. Pt states she also is having weight loss and weight gain of about 10-15 lbs. PT is requesting lab work today.    Review of Systems  Constitutional: Negative.   HENT: Negative.   Eyes: Negative.   Respiratory: Positive for chest tightness. Negative for shortness of breath.   Cardiovascular: Positive for chest pain. Negative for palpitations.  Gastrointestinal: Negative.   Endocrine: Negative.   Genitourinary: Negative.   Musculoskeletal: Positive for back pain and arthralgias.  Neurological: Negative.  Negative for headaches.  Hematological: Negative.   Psychiatric/Behavioral: Negative.   All other systems reviewed and are negative.      Objective:   Physical Exam  Constitutional: She is oriented to person, place, and time. She appears well-developed and well-nourished. No distress.  HENT:  Head: Normocephalic and atraumatic.  Eyes: Pupils are equal, round, and reactive to light.  Neck: Normal range of motion. Neck supple. No thyromegaly present.  Cardiovascular: Normal rate, regular rhythm, normal heart sounds and intact distal pulses.   No murmur heard. Pulmonary/Chest: Effort normal and breath sounds normal. No respiratory distress. She has no wheezes.  Abdominal: Soft. Bowel sounds are normal. She exhibits no distension. There is no tenderness.  Musculoskeletal: Normal range of motion. She exhibits no edema or tenderness.  Neurological: She is alert and oriented to person, place, and time. She has normal  reflexes. No cranial nerve deficit.  Skin: Skin is warm and dry.  Psychiatric: She has a normal mood and affect. Her behavior is normal. Judgment and thought content normal.  Vitals reviewed.   BP 113/78 mmHg  Pulse 61  Temp(Src) 97.8 F (36.6 C) (Oral)  Ht '5\' 8"'$  (1.727 m)  Wt 220 lb 12.8 oz (100.154 kg)  BMI 33.58 kg/m2       Assessment & Plan:  1. Joint pain - Anemia Profile B - Arthritis Panel - CMP14+EGFR - Thyroid Panel With TSH  2. Unintentional weight change - Anemia Profile B - Arthritis Panel - CMP14+EGFR - Thyroid Panel With TSH  3. Chest wall pain - Anemia Profile B - Arthritis Panel - CMP14+EGFR - Thyroid Panel With TSH  4. Other fatigue - Anemia Profile B - Arthritis Panel - CMP14+EGFR - Thyroid Panel With TSH   Labs pending- If rheumatoid factor elevated will refer Health Maintenance reviewed Diet and exercise encouraged RTO 2 weeks to recheck  Evelina Dun, FNP

## 2015-06-11 LAB — ANEMIA PROFILE B
BASOS: 0 %
Basophils Absolute: 0 10*3/uL (ref 0.0–0.2)
EOS (ABSOLUTE): 0.2 10*3/uL (ref 0.0–0.4)
Eos: 4 %
FERRITIN: 100 ng/mL (ref 15–150)
FOLATE: 12 ng/mL (ref >3.0)
Hematocrit: 37.5 % (ref 34.0–46.6)
Hemoglobin: 12.3 g/dL (ref 11.1–15.9)
IMMATURE GRANS (ABS): 0 10*3/uL (ref 0.0–0.1)
IMMATURE GRANULOCYTES: 0 %
Iron Saturation: 40 % (ref 15–55)
Iron: 98 ug/dL (ref 27–159)
LYMPHS ABS: 1.1 10*3/uL (ref 0.7–3.1)
LYMPHS: 21 %
MCH: 31 pg (ref 26.6–33.0)
MCHC: 32.8 g/dL (ref 31.5–35.7)
MCV: 95 fL (ref 79–97)
MONOS ABS: 0.3 10*3/uL (ref 0.1–0.9)
Monocytes: 7 %
NEUTROS PCT: 68 %
Neutrophils Absolute: 3.5 10*3/uL (ref 1.4–7.0)
Platelets: 229 10*3/uL (ref 150–379)
RBC: 3.97 x10E6/uL (ref 3.77–5.28)
RDW: 13.2 % (ref 12.3–15.4)
Retic Ct Pct: 1.6 % (ref 0.6–2.6)
Total Iron Binding Capacity: 244 ug/dL — ABNORMAL LOW (ref 250–450)
UIBC: 146 ug/dL (ref 131–425)
Vitamin B-12: 248 pg/mL (ref 211–946)
WBC: 5.1 10*3/uL (ref 3.4–10.8)

## 2015-06-11 LAB — CMP14+EGFR
A/G RATIO: 1.3 (ref 1.1–2.5)
ALT: 9 IU/L (ref 0–32)
AST: 15 IU/L (ref 0–40)
Albumin: 3.8 g/dL (ref 3.5–5.5)
Alkaline Phosphatase: 52 IU/L (ref 39–117)
BILIRUBIN TOTAL: 0.5 mg/dL (ref 0.0–1.2)
BUN / CREAT RATIO: 10 (ref 8–20)
BUN: 9 mg/dL (ref 6–20)
CHLORIDE: 103 mmol/L (ref 96–106)
CO2: 21 mmol/L (ref 18–29)
Calcium: 9.2 mg/dL (ref 8.7–10.2)
Creatinine, Ser: 0.91 mg/dL (ref 0.57–1.00)
GFR, EST AFRICAN AMERICAN: 95 mL/min/{1.73_m2} (ref >59)
GFR, EST NON AFRICAN AMERICAN: 83 mL/min/{1.73_m2} (ref >59)
GLOBULIN, TOTAL: 2.9 g/dL (ref 1.5–4.5)
Glucose: 86 mg/dL (ref 65–99)
POTASSIUM: 4.3 mmol/L (ref 3.5–5.2)
SODIUM: 139 mmol/L (ref 134–144)
TOTAL PROTEIN: 6.7 g/dL (ref 6.0–8.5)

## 2015-06-11 LAB — ARTHRITIS PANEL
Rhuematoid fact SerPl-aCnc: <10 IU/mL (ref 0.0–13.9)
SED RATE: 6 mm/h (ref 0–32)
Uric Acid: 4.4 mg/dL (ref 2.5–7.1)

## 2015-06-11 LAB — THYROID PANEL WITH TSH
Free Thyroxine Index: 2.6 (ref 1.2–4.9)
T3 UPTAKE RATIO: 32 % (ref 24–39)
T4, Total: 8.1 ug/dL (ref 4.5–12.0)
TSH: 1.18 u[IU]/mL (ref 0.450–4.500)

## 2015-06-25 ENCOUNTER — Encounter: Payer: Self-pay | Admitting: Family

## 2015-06-25 ENCOUNTER — Ambulatory Visit (INDEPENDENT_AMBULATORY_CARE_PROVIDER_SITE_OTHER): Payer: 59 | Admitting: Family

## 2015-06-25 VITALS — BP 121/81 | HR 66 | Temp 97.3°F | Ht 68.0 in | Wt 221.0 lb

## 2015-06-25 DIAGNOSIS — R5383 Other fatigue: Secondary | ICD-10-CM | POA: Diagnosis not present

## 2015-06-25 DIAGNOSIS — L509 Urticaria, unspecified: Secondary | ICD-10-CM

## 2015-06-25 DIAGNOSIS — M2550 Pain in unspecified joint: Secondary | ICD-10-CM | POA: Diagnosis not present

## 2015-06-25 NOTE — Patient Instructions (Addendum)
Systemic Lupus Erythematosus, Adult Systemic lupus erythematosus is a long-term (chronic) disease that can affect many parts of the body. It can damage the skin, joints, blood vessels, brain, kidneys, lungs, heart, and other internal organs. It causes pain, irritation, and inflammation. Systemic lupus erythematosus is an autoimmune disease. With this type of disease, the body's defense system (immune system) mistakenly attacks normal tissues instead of attacking germs or abnormal growths. CAUSES The cause of this condition is not known. RISK FACTORS This condition is more likely to develop in:  Females.  People of Asian descent.  People of African-American descent.  People who have a family history of the condition. SYMPTOMS General symptoms include:  Joint pain and swelling (common).  Fever.  Fatigue.  Unusual weight loss or weight gain.  Skin rashes, especially over the nose and cheeks (butterfly rash) and after sun exposure.  Sores inside the mouth or nose. Other symptoms depend on which parts of the body are affected. They can include:  Shortness of breath.  Chest pain.  Frequent urination.  Blood in the urine.  Seizures.  Mental changes.  Hair loss.  Swollen and tender lymph nodes.  Swelling of the hands or feet. Symptoms can come and go. A period of time when symptoms get worse or come back is called a flare. A period of time with no symptoms is called a remission. DIAGNOSIS This condition is diagnosed based on symptoms, a medical history, and a physical exam. You may also have tests, including:  Blood tests.  Urine tests.  A chest X-ray.  A skin or kidney biopsy. For this test, a sample of tissue is taken from the skin or kidney and studied under a microscope. You may be referred to an autoimmune disease specialist (rheumatologist). TREATMENT There is no cure for this condition, but treatment can keep the disease in remission, help to control  symptoms, and prevent damage to the heart, lungs, kidneys, and other organs. Treatment may involve taking a combination of medicines over time. HOME CARE INSTRUCTIONS Medicines  Take medicines only as directed by your health care provider.  Do not take any medicines that contain estrogen without first checking with your health care provider. Estrogen can trigger flares and may increase your risk for blood clots. Lifestyle  Eat a heart-healthy diet.  Stay active as directed by your health care provider.  Do not smoke. If you need help quitting, ask your health care provider.  Protect your skin from the sun by applying sunblock and wearing protective hats and clothing.  Learn as much as you can about your condition and have a good support system in place. Support may come from family, friends, or a lupus support group. General Instructions  Keep all follow-up visits as directed by your health care provider. This is important.  Work closely with all of your health care providers to manage your condition.  Let your health care provider know right away if you become pregnant or if you plan to become pregnant. Pregnancy in women with this condition is considered high risk. SEEK MEDICAL CARE IF:  You have a fever.  Your symptoms flare.  You develop new symptoms.  You develop swollen feet or hands.  You develop puffiness around your eyes.  Your medicines are not working.  You have bloody, foamy, or coffee-colored urine.  There are changes in your urination. For example, you urinate more often at night.  You think that you may be depressed or have anxiety. SEEK IMMEDIATE MEDICAL CARE   IF:  You have chest pain.  You have trouble breathing.  You have a seizure.  You suddenly get a very bad headache.  You suddenly develop facial or body weakness.  You cannot speak.  You cannot understand speech.   This information is not intended to replace advice given to you by your  health care provider. Make sure you discuss any questions you have with your health care provider.   Document Released: 04/17/2002 Document Revised: 09/11/2014 Document Reviewed: 04/04/2014 Elsevier Interactive Patient Education 2016 [employer] Inc[employer]  Hives Hives are itchy, red, swollen areas of the skin. They can vary in size and location on your body. Hives can come and go for hours or several days (acute hives) or for several weeks (chronic hives). Hives do not spread from person to person (noncontagious). They may get worse with scratching, exercise, and emotional stress. CAUSES   Allergic reaction to food, additives, or drugs.  Infections, including the common cold.  Illness, such as vasculitis, lupus, or thyroid disease.  Exposure to sunlight, heat, or cold.  Exercise.  Stress.  Contact with chemicals. SYMPTOMS   Red or white swollen patches on the skin. The patches may change size, shape, and location quickly and repeatedly.  Itching.  Swelling of the hands, feet, and face. This may occur if hives develop deeper in the skin. DIAGNOSIS  Your caregiver can usually tell what is wrong by performing a physical exam. Skin or blood tests may also be done to determine the cause of your hives. In some cases, the cause cannot be determined. TREATMENT  Mild cases usually get better with medicines such as antihistamines. Severe cases may require an emergency epinephrine injection. If the cause of your hives is known, treatment includes avoiding that trigger.  HOME CARE INSTRUCTIONS   Avoid causes that trigger your hives.  Take antihistamines as directed by your caregiver to reduce the severity of your hives. Non-sedating or low-sedating antihistamines are usually recommended. Do not drive while taking an antihistamine.  Take any other medicines prescribed for itching as directed by your caregiver.  Wear loose-fitting clothing.  Keep all follow-up appointments as directed by your  caregiver. SEEK MEDICAL CARE IF:   You have persistent or severe itching that is not relieved with medicine.  You have painful or swollen joints. SEEK IMMEDIATE MEDICAL CARE IF:   You have a fever.  Your tongue or lips are swollen.  You have trouble breathing or swallowing.  You feel tightness in the throat or chest.  You have abdominal pain. These problems may be the first sign of a life-threatening allergic reaction. Call your local emergency services (911 in [place] MAKE SURE YOU:   Understand these instructions.  Will watch your condition.  Will get help right away if you are not doing well or get worse.   This information is not intended to replace advice given to you by your health care provider. Make sure you discuss any questions you have with your health care provider.   Document Released: 04/27/2005 Document Revised: 05/02/2013 Document Reviewed: 07/21/2011 Elsevier Interactive Patient Education [employer]

## 2015-06-25 NOTE — Progress Notes (Signed)
   Subjective:    Patient ID: Christine Atkinson, female    DOB: 1980-12-01, 35 y.o.   MRN: [id number]   HPI Pt presents to the office today to follow up on generalized joint pain in bilateral hands, knees, and back. Pt was seen in the office on 06/10/15 and had normal anemia, arthritis panel, CMP, and Thyroid lab work. PT states she has an intermittent erythemas "whelp" rash that has occurred on her face, hip, and chest. PT states this rash could last to days to weeks. PT states it is "itchy".  Pt states she has applied steroid cream with no relief. Pt denies any anxiety, nervousness, or depression. Pt has seen Cardiologists to rule out any cardiac issues. He believe it was "MSK issue than heart".   Review of Systems  Constitutional: Negative.   HENT: Negative.   Eyes: Negative.   Respiratory: Negative.  Negative for shortness of breath.   Cardiovascular: Negative.  Negative for palpitations.  Gastrointestinal: Negative.   Endocrine: Negative.   Genitourinary: Negative.   Musculoskeletal: Negative.   Neurological: Negative.  Negative for headaches.  Hematological: Negative.   Psychiatric/Behavioral: Negative.   All other systems reviewed and are negative.      Objective:   Physical Exam  Constitutional: She is oriented to person, place, and time. She appears well-developed and well-nourished. No distress.  HENT:  Head: Normocephalic and atraumatic.  Right Ear: External ear normal.  Left Ear: External ear normal.  Nose: Nose normal.  Mouth/Throat: Oropharynx is clear and moist.  Eyes: Pupils are equal, round, and reactive to light.  Neck: Normal range of motion. Neck supple. No thyromegaly present.  Cardiovascular: Normal rate, regular rhythm, normal heart sounds and intact distal pulses.   No murmur heard. Pulmonary/Chest: Effort normal and breath sounds normal. No respiratory distress. She has no wheezes.  Abdominal: Soft. Bowel sounds are normal. She exhibits no distension. There  is no tenderness.  Musculoskeletal: Normal range of motion. She exhibits no edema or tenderness.  Neurological: She is alert and oriented to person, place, and time. She has normal reflexes. No cranial nerve deficit.  Skin: Skin is warm and dry.  Psychiatric: She has a normal mood and affect. Her behavior is normal. Judgment and thought content normal.  Vitals reviewed.   BP 121/81 mmHg  Pulse 66  Temp(Src) 97.3 F (36.3 C) (Oral)  Ht  (1.727 m)  Wt 221 lb (100.245 kg)  BMI 33.61 kg/m2       Assessment & Plan:  1. Joint pain - Sedimentation rate - Lyme Ab/Western Blot Reflex - ANA - Ambulatory referral to Rheumatology  2. Other fatigue - Sedimentation rate - Lyme Ab/Western Blot Reflex - ANA - Ambulatory referral to Rheumatology  3. Hives - Sedimentation rate - Lyme Ab/Western Blot Reflex - ANA - Ambulatory referral to Rheumatology   Patient has several symptoms that could be related to Lupus. Labs pending- Will refer rheumatologist Pt states she has had a positive ANA in the past RTO prn   Jannifer Rodney, FNP

## 2015-06-26 LAB — LYME AB/WESTERN BLOT REFLEX

## 2015-06-26 LAB — ANA: Anti Nuclear Antibody(ANA): NEGATIVE

## 2015-06-26 LAB — SEDIMENTATION RATE: SED RATE: 5 mm/h (ref 0–32)

## 2015-07-03 ENCOUNTER — Telehealth: Payer: Self-pay | Admitting: Family

## 2015-07-03 NOTE — Telephone Encounter (Signed)
Pt given appt tomorrow morning with Jannifer Rodney at 9:10, offered appt today with Dr.Vincent or Dr.Dettinger but pt declined.

## 2015-07-04 ENCOUNTER — Encounter: Payer: Self-pay | Admitting: Family

## 2015-07-04 ENCOUNTER — Ambulatory Visit (INDEPENDENT_AMBULATORY_CARE_PROVIDER_SITE_OTHER): Payer: 59 | Admitting: Family

## 2015-07-04 VITALS — BP 118/77 | HR 67 | Temp 97.7°F | Ht 68.0 in | Wt 222.4 lb

## 2015-07-04 DIAGNOSIS — J309 Allergic rhinitis, unspecified: Secondary | ICD-10-CM | POA: Diagnosis not present

## 2015-07-04 DIAGNOSIS — R42 Dizziness and giddiness: Secondary | ICD-10-CM | POA: Diagnosis not present

## 2015-07-04 DIAGNOSIS — Z09 Encounter for follow-up examination after completed treatment for conditions other than malignant neoplasm: Secondary | ICD-10-CM | POA: Diagnosis not present

## 2015-07-04 MED ORDER — FLUTICASONE PROPIONATE 50 MCG/ACT NA SUSP: 2.0000 | Freq: Every day | NASAL | Status: AC

## 2015-07-04 NOTE — Progress Notes (Signed)
   Subjective:    Patient ID: Christine Atkinson, female    DOB: Aug 23, 1980, 35 y.o.   MRN: [id number]  HPI PT presents to the office today for hospital follow up for dizziness. PT was diagnosed with vertigo. PT states Sunday morning she was woke up with constant dizziness that did not change with position. Pt states she went to the ED and was Antivert 25 mg that helps a little. PT states the dizziness is better today.   [facility] notes were reviewed.   Review of Systems  Constitutional: Negative.   HENT: Negative.   Eyes: Negative.   Respiratory: Negative.  Negative for shortness of breath.   Cardiovascular: Negative.  Negative for palpitations.  Gastrointestinal: Negative.   Endocrine: Negative.   Genitourinary: Negative.   Musculoskeletal: Negative.   Neurological: Negative.  Negative for headaches.  Hematological: Negative.   Psychiatric/Behavioral: Negative.   All other systems reviewed and are negative.      Objective:   Physical Exam  Constitutional: She is oriented to person, place, and time. She appears well-developed and well-nourished. No distress.  HENT:  Head: Normocephalic and atraumatic.  Right Ear: A middle ear effusion is present.  Left Ear: A middle ear effusion is present.  Mouth/Throat: Oropharynx is clear and moist.  Nasal passage erythemas with mild swelling    Eyes: Pupils are equal, round, and reactive to light.  Neck: Normal range of motion. Neck supple. No thyromegaly present.  Cardiovascular: Normal rate, regular rhythm, normal heart sounds and intact distal pulses.   No murmur heard. Pulmonary/Chest: Effort normal and breath sounds normal. No respiratory distress. She has no wheezes.  Abdominal: Soft. Bowel sounds are normal. She exhibits no distension. There is no tenderness.  Musculoskeletal: Normal range of motion. She exhibits no edema or tenderness.  Neurological: She is alert and oriented to person, place, and time. She has normal reflexes. No  cranial nerve deficit.  Skin: Skin is warm and dry.  Psychiatric: She has a normal mood and affect. Her behavior is normal. Judgment and thought content normal.  Vitals reviewed.   BP 118/77 mmHg  Pulse 67  Temp(Src) 97.7 F (36.5 C) (Oral)  Ht  (1.727 m)  Wt 222 lb 6.4 oz (100.88 kg)  BMI 33.82 kg/m2       Assessment & Plan:  1. Allergic rhinitis, unspecified allergic rhinitis type -Avoid allergens when possible - fluticasone (FLONASE) 50 MCG/ACT nasal spray; Place 2 sprays into both nostrils daily.  Dispense: 16 g; Refill: 6  2. Vertigo -Falls precaution discussed -Continue antivert as needed -RTO prn - fluticasone (FLONASE) 50 MCG/ACT nasal spray; Place 2 sprays into both nostrils daily.  Dispense: 16 g; Refill: 6  3. Hospital discharge follow-up   Jannifer Rodney, FNP

## 2015-07-04 NOTE — Patient Instructions (Addendum)
Vertigo Vertigo means you feel like you or your surroundings are moving when they are not. Vertigo can be dangerous if it occurs when you are at work, driving, or performing difficult activities.  CAUSES  Vertigo occurs when there is a conflict of signals sent to your brain from the visual and sensory systems in your body. There are many different causes of vertigo, including:  Infections, especially in the inner ear.  A bad reaction to a drug or misuse of alcohol and medicines.  Withdrawal from drugs or alcohol.  Rapidly changing positions, such as lying down or rolling over in bed.  A migraine headache.  Decreased blood flow to the brain.  Increased pressure in the brain from a head injury, infection, tumor, or bleeding. SYMPTOMS  You may feel as though the world is spinning around or you are falling to the ground. Because your balance is upset, vertigo can cause nausea and vomiting. You may have involuntary eye movements (nystagmus). DIAGNOSIS  Vertigo is usually diagnosed by physical exam. If the cause of your vertigo is unknown, your caregiver may perform imaging tests, such as an MRI scan (magnetic resonance imaging). TREATMENT  Most cases of vertigo resolve on their own, without treatment. Depending on the cause, your caregiver may prescribe certain medicines. If your vertigo is related to body position issues, your caregiver may recommend movements or procedures to correct the problem. In rare cases, if your vertigo is caused by certain inner ear problems, you may need surgery. HOME CARE INSTRUCTIONS   Follow your caregiver's instructions.  Avoid driving.  Avoid operating heavy machinery.  Avoid performing any tasks that would be dangerous to you or others during a vertigo episode.  Tell your caregiver if you notice that certain medicines seem to be causing your vertigo. Some of the medicines used to treat vertigo episodes can actually make them worse in some people. SEEK  IMMEDIATE MEDICAL CARE IF:   Your medicines do not relieve your vertigo or are making it worse.  You develop problems with talking, walking, weakness, or using your arms, hands, or legs.  You develop severe headaches.  Your nausea or vomiting continues or gets worse.  You develop visual changes.  A family member notices behavioral changes.  Your condition gets worse. MAKE SURE YOU:  Understand these instructions.  Will watch your condition.  Will get help right away if you are not doing well or get worse.   This information is not intended to replace advice given to you by your health care provider. Make sure you discuss any questions you have with your health care provider.   Document Released: 02/04/2005 Document Revised: 07/20/2011 Document Reviewed: 08/20/2014 Elsevier Interactive Patient Education 2016 [employer]. Allergic Rhinitis Allergic rhinitis is when the mucous membranes in the nose respond to allergens. Allergens are particles in the air that cause your body to have an allergic reaction. This causes you to release allergic antibodies. Through a chain of events, these eventually cause you to release histamine into the blood stream. Although meant to protect the body, it is this release of histamine that causes your discomfort, such as frequent sneezing, congestion, and an itchy, runny nose.  CAUSES Seasonal allergic rhinitis (hay fever) is caused by pollen allergens that may come from grasses, trees, and weeds. Year-round allergic rhinitis (perennial allergic rhinitis) is caused by allergens such as house dust mites, pet dander, and mold spores. SYMPTOMS  Nasal stuffiness (congestion).  Itchy, runny nose with sneezing and tearing of the  eyes. DIAGNOSIS Your health care provider can help you determine the allergen or allergens that trigger your symptoms. If you and your health care provider are unable to determine the allergen, skin or blood testing may be used.  Your health care provider will diagnose your condition after taking your health history and performing a physical exam. Your health care provider may assess you for other related conditions, such as asthma, pink eye, or an ear infection. TREATMENT Allergic rhinitis does not have a cure, but it can be controlled by:  Medicines that block allergy symptoms. These may include allergy shots, nasal sprays, and oral antihistamines.  Avoiding the allergen. Hay fever may often be treated with antihistamines in pill or nasal spray forms. Antihistamines block the effects of histamine. There are over-the-counter medicines that may help with nasal congestion and swelling around the eyes. Check with your health care provider before taking or giving this medicine. If avoiding the allergen or the medicine prescribed do not work, there are many new medicines your health care provider can prescribe. Stronger medicine may be used if initial measures are ineffective. Desensitizing injections can be used if medicine and avoidance does not work. Desensitization is when a patient is given ongoing shots until the body becomes less sensitive to the allergen. Make sure you follow up with your health care provider if problems continue. HOME CARE INSTRUCTIONS It is not possible to completely avoid allergens, but you can reduce your symptoms by taking steps to limit your exposure to them. It helps to know exactly what you are allergic to so that you can avoid your specific triggers. SEEK MEDICAL CARE IF:  You have a fever.  You develop a cough that does not stop easily (persistent).  You have shortness of breath.  You start wheezing.  Symptoms interfere with normal daily activities.   This information is not intended to replace advice given to you by your health care provider. Make sure you discuss any questions you have with your health care provider.   Document Released: 01/20/2001 Document Revised: 05/18/2014  Document Reviewed: 01/02/2013 Elsevier Interactive Patient Education [employer]

## 2015-07-23 DIAGNOSIS — Z0289 Encounter for other administrative examinations: Secondary | ICD-10-CM

## 2015-08-02 ENCOUNTER — Telehealth: Payer: Self-pay | Admitting: Family

## 2015-08-02 NOTE — Telephone Encounter (Signed)
Upfront, pt aware 

## 2015-09-26 ENCOUNTER — Ambulatory Visit: Payer: 59 | Admitting: Family

## 2015-09-27 ENCOUNTER — Encounter: Payer: Self-pay | Admitting: Family

## 2022-09-14 ENCOUNTER — Ambulatory Visit (INDEPENDENT_AMBULATORY_CARE_PROVIDER_SITE_OTHER): Payer: 59

## 2022-09-14 ENCOUNTER — Ambulatory Visit (INDEPENDENT_AMBULATORY_CARE_PROVIDER_SITE_OTHER): Payer: 59 | Admitting: Podiatry

## 2022-09-14 DIAGNOSIS — M722 Plantar fascial fibromatosis: Secondary | ICD-10-CM

## 2022-09-14 MED ORDER — MELOXICAM 15 MG PO TABS: 15.0000 mg | ORAL_TABLET | Freq: Every day | ORAL | 0 refills | Status: AC

## 2022-09-14 NOTE — Patient Instructions (Signed)

## 2022-09-14 NOTE — Progress Notes (Signed)
  Subjective:  Patient ID: Christine Atkinson, female    DOB: 03-12-1981,  MRN: [id number]  Chief Complaint  Patient presents with   Foot Pain    Bilateral heel pain. Left foot is worst than the right. Pain does not radiate. No injuries. Started about a month ago.     42 y.o. female presents with the above complaint.  Patient presents with concern for pain in bilateral heels left is worse than the right foot.  Patient states she has pain after standing or sleeping.  And getting out of bed.  Also pain at the end of the day after she has been on her foot she has has been icing which has been helping with her pain   Review of Systems: Negative except as noted in the HPI. Denies N/V/F/Ch.   Objective:  There were no vitals filed for this visit. There is no height or weight on file to calculate BMI. Constitutional Well developed. Well nourished.  Vascular Dorsalis pedis pulses palpable bilaterally. Posterior tibial pulses palpable bilaterally. Capillary refill normal to all digits.  No cyanosis or clubbing noted. Pedal hair growth normal.  Neurologic Normal speech. Oriented to person, place, and time. Epicritic sensation to light touch grossly present bilaterally.  Dermatologic Nails well groomed and normal in appearance. No open wounds. No skin lesions.  Orthopedic: Normal joint ROM without pain or crepitus bilaterally. No visible deformities. Tender to palpation at the calcaneal tuber bilaterally. No pain with calcaneal squeeze bilaterally. Ankle ROM diminished range of motion bilaterally. Silfverskiold Test: negative bilaterally.   Radiographs: Taken and reviewed. No acute fractures or dislocations. No evidence of stress fracture.  Plantar heel spur present. Posterior heel spur absent.   Assessment:   1. Plantar fasciitis, bilateral    Plan:  Patient was evaluated and treated and all questions answered.  Plantar Fasciitis, bilaterally - XR reviewed as above.  - Educated on  icing and stretching. Instructions given.  - Injection delivered to the plantar fascia as below. - DME: Night splint dispensed. - Pharmacologic management: Meloxicam 15 mg take 1 daily for the next 3 days. Educated on risks/benefits and proper taking of medication.  Procedure: Injection Tendon/Ligament Location: Bilateral plantar fascia at the glabrous junction; medial approach. Skin Prep: alcohol Injectate: 1 cc 0.5% marcaine plain, 1 cc kenalog 10. Disposition: Patient tolerated procedure well. Injection site dressed with a band-aid.  Return in about 4 weeks (around 10/12/2022) for f.u PF bilat.

## 2022-10-13 ENCOUNTER — Ambulatory Visit (INDEPENDENT_AMBULATORY_CARE_PROVIDER_SITE_OTHER): Payer: 59 | Admitting: Podiatry

## 2022-10-13 DIAGNOSIS — M7751 Other enthesopathy of right foot: Secondary | ICD-10-CM

## 2022-10-13 DIAGNOSIS — M7752 Other enthesopathy of left foot: Secondary | ICD-10-CM | POA: Diagnosis not present

## 2022-10-13 DIAGNOSIS — M722 Plantar fascial fibromatosis: Secondary | ICD-10-CM | POA: Diagnosis not present

## 2022-10-13 MED ORDER — MELOXICAM 15 MG PO TABS: 15.0000 mg | ORAL_TABLET | Freq: Every day | ORAL | 0 refills | Status: AC

## 2022-10-13 NOTE — Progress Notes (Signed)
  Subjective:  Patient ID: Christine Atkinson, female    DOB: Aug 28, 1980,  MRN: [id number]  Chief Complaint  Patient presents with   Follow-up    F/U bilateral plantar fasciitis. Patient continues to have pain to bilateral heels. Steroid injections helped for a couple of days and pain returned. Patient continues to take meloxicam as ordered.      42 y.o. female presents for follow-up of bilateral plantar fasciitis.  Patient has been using power step orthotics which are helping has definitely decreased her pain.  She is still having pain after being on her feet for couple hours usually after about 4 hours of walking or standing.  She is wearing good supportive shoes.  Also having pain with for steps out of bed in the morning she does have a night splint at home   Review of Systems: Negative except as noted in the HPI. Denies N/V/F/Ch.   Objective:  There were no vitals filed for this visit. There is no height or weight on file to calculate BMI. Constitutional Well developed. Well nourished.  Vascular Dorsalis pedis pulses palpable bilaterally. Posterior tibial pulses palpable bilaterally. Capillary refill normal to all digits.  No cyanosis or clubbing noted. Pedal hair growth normal.  Neurologic Normal speech. Oriented to person, place, and time. Epicritic sensation to light touch grossly present bilaterally.  Dermatologic Nails well groomed and normal in appearance. No open wounds. No skin lesions.  Orthopedic: Normal joint ROM without pain or crepitus bilaterally. No visible deformities. Tender to palpation at the plantar calcaneal bursa bilaterally. No pain with calcaneal squeeze bilaterally. Ankle ROM diminished range of motion bilaterally. Silfverskiold Test: negative bilaterally.   Radiographs: Taken and reviewed. No acute fractures or dislocations. No evidence of stress fracture.  Plantar heel spur present. Posterior heel spur absent.   Assessment:   1. Calcaneal bursitis  (heel), right   2. Calcaneal bursitis (heel), left   3. Plantar fasciitis, bilateral     Plan:  Patient was evaluated and treated and all questions answered.  #Plantar Fasciitis, bilaterally -improved, # Bilateral calcaneal bursitis - XR reviewed as above.  - Educated on icing and stretching. Instructions given.  - Injection delivered to the bilateral plantar calcaneal bursa - DME: Continue power steps and night splint - Pharmacologic management: Meloxicam 15 mg take 1 daily for the next 3 days. Educated on risks/benefits and proper taking of medication.  Procedure: Injection plantar calcaneal bursa bilateral Location: Bilateral plantar calcaneal bursa at the glabrous junction; medial approach. Skin Prep: alcohol Injectate: 1 cc 0.5% marcaine plain, 1 cc kenalog 10. Disposition: Patient tolerated procedure well. Injection site dressed with a band-aid.  Return in about 6 weeks (around 11/24/2022) for Follow-up plantar calcaneal bursitis bilateral.

## 2022-10-13 NOTE — Patient Instructions (Signed)

## 2022-10-14 ENCOUNTER — Other Ambulatory Visit: Payer: Self-pay | Admitting: Podiatry

## 2022-10-15 NOTE — Telephone Encounter (Signed)
Already prescribed recently

## 2022-11-26 ENCOUNTER — Ambulatory Visit: Payer: 59 | Admitting: Podiatry

## 2022-12-03 ENCOUNTER — Ambulatory Visit (INDEPENDENT_AMBULATORY_CARE_PROVIDER_SITE_OTHER): Payer: 59 | Admitting: Podiatry

## 2022-12-03 DIAGNOSIS — M7751 Other enthesopathy of right foot: Secondary | ICD-10-CM

## 2022-12-03 DIAGNOSIS — M7752 Other enthesopathy of left foot: Secondary | ICD-10-CM | POA: Diagnosis not present

## 2022-12-03 MED ORDER — MELOXICAM 15 MG PO TABS: 15.0000 mg | ORAL_TABLET | Freq: Every day | ORAL | 1 refills | Status: AC

## 2022-12-03 MED ORDER — CICLOPIROX 8 % EX SOLN: Freq: Every day | CUTANEOUS | 0 refills | Status: AC

## 2022-12-03 NOTE — Progress Notes (Signed)
  Subjective:  Patient ID: Christine Atkinson, female    DOB: 13-Oct-1980,  MRN: [id number]  Chief Complaint  Patient presents with   Follow-up    Follow-up plantar calcaneal bursitis bilateral. Continues to have pain to bilateral heels. The pain is better and not as constant.     42 y.o. female presents for follow-up of bilateral plantar heel bursitis.  She says the right heel is doing better but the left heel still having pain.  Wearing power steps using icing and stretching exercises.   Review of Systems: Negative except as noted in the HPI. Denies N/V/F/Ch.   Objective:  There were no vitals filed for this visit. There is no height or weight on file to calculate BMI. Constitutional Well developed. Well nourished.  Vascular Dorsalis pedis pulses palpable bilaterally. Posterior tibial pulses palpable bilaterally. Capillary refill normal to all digits.  No cyanosis or clubbing noted. Pedal hair growth normal.  Neurologic Normal speech. Oriented to person, place, and time. Epicritic sensation to light touch grossly present bilaterally.  Dermatologic Nails well groomed and normal in appearance. No open wounds. No skin lesions.  Orthopedic: Normal joint ROM without pain or crepitus bilaterally. No visible deformities. Tender to palpation at the plantar calcaneal bursa left No pain with calcaneal squeeze bilaterally. Ankle ROM diminished range of motion bilaterally. Silfverskiold Test: negative bilaterally.   Radiographs: Taken and reviewed. No acute fractures or dislocations. No evidence of stress fracture.  Plantar heel spur present. Posterior heel spur absent.   Assessment:   1. Calcaneal bursitis (heel), left   2. Calcaneal bursitis (heel), right      Plan:  Patient was evaluated and treated and all questions answered.  #Plantar Fasciitis, bilaterally -improved, # Bilateral calcaneal bursitis - Right improved. Left still with pain - XR reviewed as above.  - Educated on  icing and stretching. Instructions given.  - Injection delivered to the bilateral plantar calcaneal bursa - DME: Continue power steps and night splint - Pharmacologic management: Meloxicam 15 mg take 1 daily for the next 3 days. Educated on risks/benefits and proper taking of medication.  Procedure: Injection plantar calcaneal bursa bilateral Location: Left plantar calcaneal bursa at the glabrous junction; medial approach. Skin Prep: alcohol Injectate: 1 cc 0.5% marcaine plain, 1 cc kenalog 10. Disposition: Patient tolerated procedure well. Injection site dressed with a band-aid.  # Onychomycosis -eRx for Penlac topical 8% solution apply daily to all nails for the next 90 days  Return if symptoms worsen or fail to improve.

## 2023-05-13 ENCOUNTER — Other Ambulatory Visit: Payer: Self-pay | Admitting: General Surgery

## 2023-05-14 LAB — SURGICAL PATHOLOGY

## 2024-01-17 ENCOUNTER — Ambulatory Visit (INDEPENDENT_AMBULATORY_CARE_PROVIDER_SITE_OTHER): Admitting: Podiatry

## 2024-01-17 ENCOUNTER — Encounter: Payer: Self-pay | Admitting: Podiatry

## 2024-01-17 ENCOUNTER — Ambulatory Visit (INDEPENDENT_AMBULATORY_CARE_PROVIDER_SITE_OTHER)

## 2024-01-17 VITALS — Ht 68.0 in | Wt 222.0 lb

## 2024-01-17 DIAGNOSIS — M722 Plantar fascial fibromatosis: Secondary | ICD-10-CM

## 2024-01-17 NOTE — Progress Notes (Signed)
 Subjective:   Patient ID: Christine Atkinson, female   DOB: 43 y.o.   MRN: [id number]   HPI Patient presents with severe pain in the heel left over right and admits she did not get any results from last year's treatments and that it to a degree tends to roll her life and she is not able to be active.  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  Good digital perfusion well-oriented     Assessment:  Acute plantar fasciitis left over right that got 3-year history has gotten worse over that time and has not responded to conservative treatments consisting of shoe gear modifications anti-inflammatory injection treatment     Plan:  H&P reviewed discussed treatment options and I have recommended at this point due to this 3-year history failure to respond conservatively that surgical intervention would be best in this case.  Patient wants to go this route and at this point I spent a great deal time going over with her surgery and allowed her to read consent form going over alternative treatments complications.  Patient scheduled for outpatient surgery all questions answered today and she will have this done on an outpatient basis.  I also went ahead today I dispensed a air fracture walker to immobilize I advised on stretching therapy and ice and to get used to the boot prior to the surgery  X-rays indicate that there is spur formation bilateral depression of the arch bilateral no indication stress fracture or arthritis

## 2024-01-26 ENCOUNTER — Telehealth: Payer: Self-pay | Admitting: Podiatry

## 2024-01-26 NOTE — Telephone Encounter (Signed)
 Pt left message stating she needed to move her surgery from 9/23 to 9/30.  I notified surgery center

## 2024-02-03 ENCOUNTER — Telehealth: Payer: Self-pay | Admitting: Podiatry

## 2024-02-03 NOTE — Telephone Encounter (Signed)
 Received message from surgery center that pt wanted to cancel her surgery.  I contacted pt and she states the boot that she was given is helping currently and she wants to wait and try that for a few weeks and will call to schedule an appt to discuss further.

## 2024-02-07 ENCOUNTER — Encounter: Admitting: Podiatry

## 2024-02-14 ENCOUNTER — Encounter

## 2024-02-21 ENCOUNTER — Encounter

## 2024-02-28 ENCOUNTER — Encounter
